# Patient Record
Sex: Male | Born: 1985
Health system: Southern US, Community
[De-identification: ages and names within clinical notes are randomized; demographics above are authoritative.]

## PROBLEM LIST (undated history)

## (undated) DIAGNOSIS — B019 Varicella without complication: Secondary | ICD-10-CM

## (undated) DIAGNOSIS — R7303 Prediabetes: Secondary | ICD-10-CM

## (undated) HISTORY — PX: NO PAST SURGERIES: SHX2092

## (undated) HISTORY — DX: Varicella without complication: B01.9

## (undated) HISTORY — DX: Prediabetes: R73.03

---

## 2016-08-09 ENCOUNTER — Encounter: Payer: Self-pay | Admitting: Family Medicine

## 2016-08-09 ENCOUNTER — Ambulatory Visit (INDEPENDENT_AMBULATORY_CARE_PROVIDER_SITE_OTHER): Payer: 59 | Admitting: Family Medicine

## 2016-08-09 VITALS — BP 122/82 | HR 69 | Temp 99.0°F | Resp 18 | Ht 67.0 in | Wt 232.0 lb

## 2016-08-09 DIAGNOSIS — Z0000 Encounter for general adult medical examination without abnormal findings: Secondary | ICD-10-CM

## 2016-08-09 DIAGNOSIS — Z130 Encounter for screening for diseases of the blood and blood-forming organs and certain disorders involving the immune mechanism: Secondary | ICD-10-CM | POA: Diagnosis not present

## 2016-08-09 DIAGNOSIS — E669 Obesity, unspecified: Secondary | ICD-10-CM

## 2016-08-09 LAB — COMPREHENSIVE METABOLIC PANEL
ALT: 25 U/L (ref 0–53)
AST: 13 U/L (ref 0–37)
Albumin: 4.4 g/dL (ref 3.5–5.2)
Alkaline Phosphatase: 35 U/L — ABNORMAL LOW (ref 39–117)
BILIRUBIN TOTAL: 0.5 mg/dL (ref 0.2–1.2)
BUN: 19 mg/dL (ref 6–23)
CALCIUM: 9.6 mg/dL (ref 8.4–10.5)
CHLORIDE: 105 meq/L (ref 96–112)
CO2: 29 meq/L (ref 19–32)
CREATININE: 0.92 mg/dL (ref 0.40–1.50)
GFR: 100.71 mL/min (ref >60.00)
GLUCOSE: 91 mg/dL (ref 70–99)
Potassium: 4.5 mEq/L (ref 3.5–5.1)
SODIUM: 139 meq/L (ref 135–145)
Total Protein: 7.6 g/dL (ref 6.0–8.3)

## 2016-08-09 LAB — LIPID PANEL
CHOL/HDL RATIO: 4
Cholesterol: 181 mg/dL (ref 0–200)
HDL: 43 mg/dL (ref >39.00)
LDL CALC: 121 mg/dL — AB (ref 0–99)
NONHDL: 137.72
Triglycerides: 83 mg/dL (ref 0.0–149.0)
VLDL: 16.6 mg/dL (ref 0.0–40.0)

## 2016-08-09 LAB — CBC
HCT: 43.7 % (ref 39.0–52.0)
HEMOGLOBIN: 14.4 g/dL (ref 13.0–17.0)
MCHC: 33 g/dL (ref 30.0–36.0)
MCV: 73.6 fl — ABNORMAL LOW (ref 78.0–100.0)
Platelets: 319 10*3/uL (ref 150.0–400.0)
RBC: 5.94 Mil/uL — ABNORMAL HIGH (ref 4.22–5.81)
RDW: 14.6 % (ref 11.5–15.5)
WBC: 12.3 10*3/uL — ABNORMAL HIGH (ref 4.0–10.5)

## 2016-08-09 LAB — HEMOGLOBIN A1C: Hgb A1c MFr Bld: 5.8 % (ref 4.6–6.5)

## 2016-08-09 MED ORDER — VARENICLINE TARTRATE 1 MG PO TABS: 1.0000 mg | ORAL_TABLET | Freq: Two times a day (BID) | ORAL | 0 refills | Status: DC

## 2016-08-09 MED ORDER — VARENICLINE TARTRATE 0.5 MG X 11 & 1 MG X 42 PO MISC: ORAL | 0 refills | Status: DC

## 2016-08-09 NOTE — Assessment & Plan Note (Signed)
Screening labs today. He is losing weight. Starting Chantix for smoking cessation.

## 2016-08-09 NOTE — Patient Instructions (Signed)
Chantix as prescribed.  Follow up in 3-6 months. Call for refill on Chantix after 12 weeks if desired.  Take care  Dr. Adriana Simasook    Health Maintenance, Male A healthy lifestyle and preventive care is important for your health and wellness. Ask your health care provider about what schedule of regular examinations is right for you. What should I know about weight and diet? Eat a Healthy Diet  Eat plenty of vegetables, fruits, whole grains, low-fat dairy products, and lean protein.  Do not eat a lot of foods high in solid fats, added sugars, or salt.  Maintain a Healthy Weight Regular exercise can help you achieve or maintain a healthy weight. You should:  Do at least 150 minutes of exercise each week. The exercise should increase your heart rate and make you sweat (moderate-intensity exercise).  Do strength-training exercises at least twice a week.  Watch Your Levels of Cholesterol and Blood Lipids  Have your blood tested for lipids and cholesterol every 5 years starting at 33 years of age. If you are at high risk for heart disease, you should start having your blood tested when you are 33 years old. You may need to have your cholesterol levels checked more often if: ? Your lipid or cholesterol levels are high. ? You are older than 33 years of age. ? You are at high risk for heart disease.  What should I know about cancer screening? Many types of cancers can be detected early and may often be prevented. Lung Cancer  You should be screened every year for lung cancer if: ? You are a current smoker who has smoked for at least 30 years. ? You are a former smoker who has quit within the past 15 years.  Talk to your health care provider about your screening options, when you should start screening, and how often you should be screened.  Colorectal Cancer  Routine colorectal cancer screening usually begins at 33 years of age and should be repeated every 5-10 years until you are 33 years  old. You may need to be screened more often if early forms of precancerous polyps or small growths are found. Your health care provider may recommend screening at an earlier age if you have risk factors for colon cancer.  Your health care provider may recommend using home test kits to check for hidden blood in the stool.  A small camera at the end of a tube can be used to examine your colon (sigmoidoscopy or colonoscopy). This checks for the earliest forms of colorectal cancer.  Prostate and Testicular Cancer  Depending on your age and overall health, your health care provider may do certain tests to screen for prostate and testicular cancer.  Talk to your health care provider about any symptoms or concerns you have about testicular or prostate cancer.  Skin Cancer  Check your skin from head to toe regularly.  Tell your health care provider about any new moles or changes in moles, especially if: ? There is a change in a mole's size, shape, or color. ? You have a mole that is larger than a pencil eraser.  Always use sunscreen. Apply sunscreen liberally and repeat throughout the day.  Protect yourself by wearing long sleeves, pants, a wide-brimmed hat, and sunglasses when outside.  What should I know about heart disease, diabetes, and high blood pressure?  If you are 6218-33 years of age, have your blood pressure checked every 3-5 years. If you are 33 years of age  or older, have your blood pressure checked every year. You should have your blood pressure measured twice-once when you are at a hospital or clinic, and once when you are not at a hospital or clinic. Record the average of the two measurements. To check your blood pressure when you are not at a hospital or clinic, you can use: ? An automated blood pressure machine at a pharmacy. ? A home blood pressure monitor.  Talk to your health care provider about your target blood pressure.  If you are between 54-58 years old, ask your  health care provider if you should take aspirin to prevent heart disease.  Have regular diabetes screenings by checking your fasting blood sugar level. ? If you are at a normal weight and have a low risk for diabetes, have this test once every three years after the age of 47. ? If you are overweight and have a high risk for diabetes, consider being tested at a younger age or more often.  A one-time screening for abdominal aortic aneurysm (AAA) by ultrasound is recommended for men aged 9-75 years who are current or former smokers. What should I know about preventing infection? Hepatitis B If you have a higher risk for hepatitis B, you should be screened for this virus. Talk with your health care provider to find out if you are at risk for hepatitis B infection. Hepatitis C Blood testing is recommended for:  Everyone born from [birth year] through 1967.  Anyone with known risk factors for hepatitis C.  Sexually Transmitted Diseases (STDs)  You should be screened each year for STDs including gonorrhea and chlamydia if: ? You are sexually active and are younger than 33 years of age. ? You are older than 33 years of age and your health care provider tells you that you are at risk for this type of infection. ? Your sexual activity has changed since you were last screened and you are at an increased risk for chlamydia or gonorrhea. Ask your health care provider if you are at risk.  Talk with your health care provider about whether you are at high risk of being infected with HIV. Your health care provider may recommend a prescription medicine to help prevent HIV infection.  What else can I do?  Schedule regular health, dental, and eye exams.  Stay current with your vaccines (immunizations).  Do not use any tobacco products, such as cigarettes, chewing tobacco, and e-cigarettes. If you need help quitting, ask your health care provider.  Limit alcohol intake to no more than 2 drinks per day. One  drink equals 12 ounces of beer, 5 ounces of wine, or 1 ounces of hard liquor.  Do not use street drugs.  Do not share needles.  Ask your health care provider for help if you need support or information about quitting drugs.  Tell your health care provider if you often feel depressed.  Tell your health care provider if you have ever been abused or do not feel safe at home. This information is not intended to replace advice given to you by your health care provider. Make sure you discuss any questions you have with your health care provider. Document Released: 07/24/2007 Document Revised: 09/24/2015 Document Reviewed: 10/29/2014 Elsevier Interactive Patient Education  [employer].

## 2016-08-09 NOTE — Progress Notes (Signed)
Subjective:  Patient ID: Benjamin Ibarra, male    DOB: 05/14/1985  Age: 31 y.o. MRN: [id number]  CC: Annual exam; wants to quit smoking.  HPI Benjamin Ibarra is a 31 y.o. male presents to the clinic today for an annual exam. Wants to quit smoking.  Preventative Healthcare  Immunizations  Tetanus - Up to date.  Pneumococcal - Candidate for given smoking history.  Labs: Screening labs today.  Alcohol use: 1x/week.  Smoking/tobacco use: Current smoker. Wants to quit. Has tried Chantix.  PMH, Surgical Hx, Family Hx, Social History reviewed and updated as below.  Past Medical History:  Diagnosis Date  . Chicken pox    Past Surgical History:  Procedure Laterality Date  . NO PAST SURGERIES     Family History  Problem Relation Age of Onset  . Diabetes Mother   . Alzheimer's disease Maternal Grandmother   . Cancer Maternal Grandfather    Social History  Substance Use Topics  . Smoking status: Current Every Day Smoker    Packs/day: 1.00  . Smokeless tobacco: Never Used  . Alcohol use Yes   Review of Systems General: Denies unexplained weight loss, fever. Skin: Denies new or changing mole, sore/wound that won't heal. ENT: Trouble hearing, ringing in the ears, sores in the mouth, hoarseness, trouble swallowing. Eyes: Denies trouble seeing/visual disturbance. Heart/CV: Denies chest pain, shortness of breath, edema, palpitations. Lungs/Resp: Denies cough, shortness of breath, hemoptysis. Abd/GI: Denies nausea, vomiting, diarrhea, constipation, abdominal pain, hematochezia, melena. GU: Denies dysuria, incontinence, hematuria, urinary frequency, difficulty starting/keeping stream, penile discharge, sexual difficulty, lump in testicles. MSK: Denies joint pain/swelling, myalgias. Neuro: Denies headaches, weakness, numbness, dizziness, syncope. Psych: Denies sadness, anxiety, stress, memory difficulty. Endocrine: Denies polyuria and polydipsia.  Objective:   Today's Vitals: BP  122/82 (BP Location: Left Arm, Patient Position: Sitting, Cuff Size: Large)   Pulse 69   Temp 99 F (37.2 C) (Oral)   Resp 18   Ht 5\' 7"  (1.702 m)   Wt 232 lb (105.2 kg)   SpO2 98%   BMI 36.34 kg/m   Physical Exam  Constitutional: He is oriented to person, place, and time. He appears well-developed and well-nourished. No distress.  HENT:  Head: Normocephalic and atraumatic.  Nose: Nose normal.  Mouth/Throat: Oropharynx is clear and moist. No oropharyngeal exudate.  Normal TM's bilaterally.   Eyes: Conjunctivae are normal. No scleral icterus.  Neck: Neck supple.  Cardiovascular: Normal rate and regular rhythm.   No murmur heard. Pulmonary/Chest: Effort normal and breath sounds normal. He has no wheezes. He has no rales.  Abdominal: Soft. He exhibits no distension. There is no tenderness. There is no rebound and no guarding.  Musculoskeletal: Normal range of motion. He exhibits no edema.  Lymphadenopathy:    He has no cervical adenopathy.  Neurological: He is alert and oriented to person, place, and time.  Skin: Skin is warm and dry. No rash noted.  Psychiatric: He has a normal mood and affect.  Vitals reviewed.  Assessment & Plan:   Problem List Items Addressed This Visit      Other   Annual physical exam - Primary    Screening labs today. He is losing weight. Starting Chantix for smoking cessation.      Obesity (BMI 35.0-39.9 without comorbidity)   Relevant Orders   Hemoglobin A1c   Comprehensive metabolic panel   Lipid panel    Other Visit Diagnoses    Screening for deficiency anemia       Relevant Orders  CBC     Meds ordered this encounter  Medications  . varenicline (CHANTIX STARTING MONTH PAK) 0.5 MG X 11 & 1 MG X 42 tablet    Sig: 0.5 mg by mouth once daily for 3 days, then increase to 0.5 mg twice daily for 4 days, then increase to 1 mg tablet twice daily.    Dispense:  53 tablet    Refill:  0  . varenicline (CHANTIX CONTINUING MONTH PAK) 1 MG  tablet    Sig: Take 1 tablet (1 mg total) by mouth 2 (two) times daily.    Dispense:  120 tablet    Refill:  0    Follow-up: 3-6 months  Ferman Basilio Adriana Simasook DO [facility] [place]

## 2016-09-25 ENCOUNTER — Other Ambulatory Visit: Payer: Self-pay | Admitting: Family Medicine

## 2016-09-27 NOTE — Telephone Encounter (Signed)
Please advise to filling script for starter pack of chantix?

## 2016-09-27 NOTE — Telephone Encounter (Signed)
Shouldn't be refilling starter pack.

## 2016-10-03 ENCOUNTER — Other Ambulatory Visit: Payer: Self-pay | Admitting: Family Medicine

## 2016-10-04 NOTE — Telephone Encounter (Signed)
Should this be the regular dose pack, since starting pack was already sent?

## 2016-10-06 NOTE — Telephone Encounter (Signed)
Please find out what he has been taking recently. Thanks.

## 2016-10-07 MED ORDER — VARENICLINE TARTRATE 1 MG PO TABS: 1.0000 mg | ORAL_TABLET | Freq: Two times a day (BID) | ORAL | 0 refills | Status: DC

## 2016-10-07 NOTE — Telephone Encounter (Signed)
Sent to pharmacy 

## 2016-10-07 NOTE — Telephone Encounter (Signed)
Spoke with the patient he needs the continuation pack, not the starting has been on it for the past month, thanks

## 2017-05-16 ENCOUNTER — Ambulatory Visit (INDEPENDENT_AMBULATORY_CARE_PROVIDER_SITE_OTHER): Payer: 59 | Admitting: Internal Medicine

## 2017-05-16 ENCOUNTER — Encounter: Payer: Self-pay | Admitting: Internal Medicine

## 2017-05-16 VITALS — BP 122/86 | HR 88 | Temp 98.8°F | Ht 67.0 in | Wt 246.0 lb

## 2017-05-16 DIAGNOSIS — Z716 Tobacco abuse counseling: Secondary | ICD-10-CM

## 2017-05-16 DIAGNOSIS — R7303 Prediabetes: Secondary | ICD-10-CM | POA: Insufficient documentation

## 2017-05-16 DIAGNOSIS — F17210 Nicotine dependence, cigarettes, uncomplicated: Secondary | ICD-10-CM | POA: Diagnosis not present

## 2017-05-16 DIAGNOSIS — Z720 Tobacco use: Secondary | ICD-10-CM

## 2017-05-16 MED ORDER — VARENICLINE TARTRATE 1 MG PO TABS: 1.0000 mg | ORAL_TABLET | Freq: Two times a day (BID) | ORAL | 3 refills | Status: AC

## 2017-05-16 MED ORDER — BUPROPION HCL ER (SR) 150 MG PO TB12: ORAL_TABLET | ORAL | 3 refills | Status: AC

## 2017-05-16 MED ORDER — VARENICLINE TARTRATE 0.5 MG PO TABS: ORAL_TABLET | ORAL | 0 refills | Status: AC

## 2017-05-16 NOTE — Progress Notes (Signed)
Chief Complaint  Patient presents with  . Follow-up    transfer From Dr. Adriana Simasook   F/u  1. Tobacco abuse 1 ppd wants refill of Chantix he was on 0.5 bid and stopped taking ~ 1-2 months ago. He did not like gum in the past and patch did not help  2. Disc prev prediabetes pt will start exercising.  3. 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  HENT: Negative for hearing loss.   Eyes: Negative for blurred vision.  Respiratory: Positive for shortness of breath.        SOB with exertion   Cardiovascular: Negative for chest pain.  Gastrointestinal: Negative for abdominal pain and blood in stool.  Musculoskeletal: Negative for joint pain.  Skin: Negative for rash.  Neurological: Negative for headaches.  Psychiatric/Behavioral: Negative for depression.   Past Medical History:  Diagnosis Date  . Chicken pox   . Prediabetes    Past Surgical History:  Procedure Laterality Date  . NO PAST SURGERIES     Family History  Problem Relation Age of Onset  . Diabetes Mother   . Alzheimer's disease Maternal Grandmother   . Cancer Maternal Grandfather    Social History   Socioeconomic History  . Marital status: Married    Spouse name: Not on file  . Number of children: Not on file  . Years of education: Not on file  . Highest education level: Not on file  Occupational History  . Not on file  Social Needs  . Financial resource strain: Not on file  . Food insecurity:    Worry: Not on file    Inability: Not on file  . Transportation needs:    Medical: Not on file    Non-medical: Not on file  Tobacco Use  . Smoking status: Current Every Day Smoker    Packs/day: 1.00  . Smokeless tobacco: Never Used  Substance and Sexual Activity  . Alcohol use: Yes  . Drug use: No  . Sexual activity: Yes  Lifestyle  . Physical activity:    Days per week: Not on file    Minutes per session: Not on file  . Stress: Not on file   Relationships  . Social connections:    Talks on phone: Not on file    Gets together: Not on file    Attends religious service: Not on file    Active member of club or organization: Not on file    Attends meetings of clubs or organizations: Not on file    Relationship status: Not on file  . Intimate partner violence:    Fear of current or ex partner: Not on file    Emotionally abused: Not on file    Physically abused: Not on file    Forced sexual activity: Not on file  Other Topics Concern  . Not on file  Social History Narrative   Emergency planning/management officerolice officer in [place]    Married    2 kids    Family from [place]   Current Meds  Medication Sig  . varenicline (CHANTIX) 0.5 MG tablet 0.5 daily x 3 days then increase 0.5 2x per day day 4-7  . [DISCONTINUED] varenicline (CHANTIX CONTINUING MONTH PAK) 1 MG tablet Take 1 tablet (1 mg total) by mouth 2 (two) times daily.   Allergies  Allergen Reactions  . Apple Itching    redness of skin  . Soy Allergy Itching   No  results found for this or any previous visit (from the past 2160 hour(s)). Objective  Body mass index is 38.53 kg/m. Wt Readings from Last 3 Encounters:  05/16/17 246 lb (111.6 kg)  08/09/16 232 lb (105.2 kg)   Temp Readings from Last 3 Encounters:  05/16/17 98.8 F (37.1 C) (Oral)  08/09/16 99 F (37.2 C) (Oral)   BP Readings from Last 3 Encounters:  05/16/17 122/86  08/09/16 122/82   Pulse Readings from Last 3 Encounters:  05/16/17 88  08/09/16 69    Physical Exam  Constitutional: He is oriented to person, place, and time. Vital signs are normal. He appears well-developed and well-nourished.  HENT:  Head: Normocephalic and atraumatic.  Mouth/Throat: Oropharynx is clear and moist and mucous membranes are normal.  Eyes: Pupils are equal, round, and reactive to light. Conjunctivae are normal.  Cardiovascular: Normal rate, regular rhythm and normal heart sounds.  Pulmonary/Chest: Effort normal and breath sounds  normal.  Neurological: He is alert and oriented to person, place, and time. He has normal strength. Gait normal.  Skin: Skin is warm, dry and intact.  Psychiatric: He has a normal mood and affect. His speech is normal and behavior is normal. Judgment and thought content normal. Cognition and memory are normal.  Nursing note and vitals reviewed.   Assessment   1. Tobacco abuse  2. preDM  3. HM Plan  1. Will Rx Chantix 0.5 mg qd x 3 days increase to bid x 4-7 days then increase to 1 mg bid  Add wellbutrin as well  rec smoking cessation  Spent 3 minutes disc options smoking cessation   2. Exercise to lose and healthy diet choices  Advise recheck A1C  3.  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 French Ana McLean-Scocuzza-Internal Medicine

## 2017-05-16 NOTE — Patient Instructions (Addendum)
F/u in 3-4 months Check with police dept on hepatitis B titer surface antibody titer needs to be 10 or greater for protection  Fax copy of recent labs to me Dr. French Ana McLean-Scocuzza [phone]  Make sure they checked CMET, CBC, urine, TSH, T4, lipid, hep B titer  Have them check A1C   Results for Benjamin Ibarra, Benjamin Ibarra (MRN [id number]) as of 05/16/2017 16:09  Ref. Range 08/09/2016 14:04  Hemoglobin A1C Latest Ref Range: 4.6 - 6.5 % 5.8  Prediabetic range     Smoking Tobacco Information Smoking tobacco will very likely harm your health. Tobacco contains a poisonous (toxic), colorless chemical called nicotine. Nicotine affects the brain and makes tobacco addictive. This change in your brain can make it hard to stop smoking. Tobacco also has other toxic chemicals that can hurt your body and raise your risk of many cancers. How can smoking tobacco affect me? Smoking tobacco can increase your chances of having serious health conditions, such as:  Cancer. Smoking is most commonly associated with lung cancer, but can lead to cancer in other parts of the body.  Chronic obstructive pulmonary disease (COPD). This is a long-term lung condition that makes it hard to breathe. It also gets worse over time.  High blood pressure (hypertension), heart disease, stroke, or heart attack.  Lung infections, such as pneumonia.  Cataracts. This is when the lenses in the eyes become clouded.  Digestive problems. This may include peptic ulcers, heartburn, and gastroesophageal reflux disease (GERD).  Oral health problems, such as gum disease and tooth loss.  Loss of taste and smell.  Smoking can affect your appearance by causing:  Wrinkles.  Yellow or stained teeth, fingers, and fingernails.  Smoking tobacco can also affect your social life.  Many workplaces, [employer], hotels, and public places are tobacco-free. This means that you may experience challenges in finding places to smoke when away from  home.  The cost of a smoking habit can be expensive. Expenses for someone who smokes come in two ways: ? You spend money on a regular basis to buy tobacco. ? Your health care costs in the long-term are higher if you smoke.  Tobacco smoke can also affect the health of those around you. Children of smokers have greater chances of: ? Sudden infant death syndrome (SIDS). ? Ear infections. ? Lung infections.  What lifestyle changes can be made?  Do not start smoking. Quit if you already do.  To quit smoking: ? Make a plan to quit smoking and commit yourself to it. Look for programs to help you and ask your health care provider for recommendations and ideas. ? Talk with your health care provider about using nicotine replacement medicines to help you quit. Medicine replacement medicines include gum, lozenges, patches, sprays, or pills. ? Do not replace cigarette smoking with electronic cigarettes, which are commonly called e-cigarettes. The safety of e-cigarettes is not known, and some may contain harmful chemicals. ? Avoid places, people, or situations that tempt you to smoke. ? If you try to quit but return to smoking, don't give up hope. It is very common for people to try a number of times before they fully succeed. When you feel ready again, give it another try.  Quitting smoking might affect the way you eat as well as your weight. Be prepared to monitor your eating habits. Get support in planning and following a healthy diet.  Ask your health care provider about having regular tests (screenings) to check for cancer.  This may include blood tests, imaging tests, and other tests.  Exercise regularly. Consider taking walks, joining a gym, or doing yoga or exercise classes.  Develop skills to manage your stress. These skills include meditation. What are the benefits of quitting smoking? By quitting smoking, you may:  Lower your risk of getting cancer and other diseases caused by  smoking.  Live longer.  Breathe better.  Lower your blood pressure and heart rate.  Stop your addiction to tobacco.  Stop creating secondhand smoke that hurts other people.  Improve your sense of taste and smell.  Look better over time, due to having fewer wrinkles and less staining.  What can happen if changes are not made? If you do not stop smoking, you may:  Get cancer and other diseases.  Develop COPD or other long-term (chronic) lung conditions.  Develop serious problems with your heart and blood vessels (cardiovascular system).  Need more tests to screen for problems caused by smoking.  Have higher, long-term healthcare costs from medicines or treatments related to smoking.  Continue to have worsening changes in your lungs, mouth, and nose.  Where to find support: To get support to quit smoking, consider:  Asking your health care provider for more information and resources.  Taking classes to learn more about quitting smoking.  Looking for local organizations that offer resources about quitting smoking.  Joining a support group for people who want to quit smoking in your local community.  Where to find more information: You may find more information about quitting smoking from:  HelpGuide.org: www.helpguide.org/articles/addictions/how-to-quit-smoking.htm  BankRights[employer]: smokefree.gov  American Lung Association: www.lung.org  Contact a health care provider if:  You have problems breathing.  Your lips, nose, or fingers turn blue.  You have chest pain.  You are coughing up blood.  You feel faint or you pass out.  You have other noticeable changes that cause you to worry. Summary  Smoking tobacco can negatively affect your health, the health of those around you, your finances, and your social life.  Do not start smoking. Quit if you already do. If you need help quitting, ask your health care provider.  Think about joining a support group for  people who want to quit smoking in your local community. There are many effective programs that will help you to quit this behavior. This information is not intended to replace advice given to you by your health care provider. Make sure you discuss any questions you have with your health care provider. Document Released: 02/10/2016 Document Revised: 02/10/2016 Document Reviewed: 02/10/2016 Elsevier Interactive Patient Education  2018 [employer].   Bupropion sustained-release tablets (Depression/Mood Disorders) What is this medicine? BUPROPION (byoo PROE pee on) is used to treat depression. This medicine may be used for other purposes; ask your health care provider or pharmacist if you have questions. COMMON BRAND NAME(S): Budeprion SR, Wellbutrin SR What should I tell my health care provider before I take this medicine? They need to know if you have any of these conditions: -an eating disorder, such as anorexia or bulimia -bipolar disorder or psychosis -diabetes or high blood sugar, treated with medication -glaucoma -head injury or brain tumor -heart disease, previous heart attack, or irregular heart beat -high blood pressure -kidney or liver disease -seizures -suicidal thoughts or a previous suicide attempt -Tourette's syndrome -weight loss -an unusual or allergic reaction to bupropion, other medicines, foods, dyes, or preservatives -breast-feeding -pregnant or trying to become pregnant How should I use this medicine? Take this medicine  by mouth with a glass of water. Follow the directions on the prescription label. You can take it with or without food. If it upsets your stomach, take it with food. Do not cut, crush or chew this medicine. Take your medicine at regular intervals. If you take this medicine more than once a day, take your second dose at least 8 hours after you take your first dose. To limit difficulty in sleeping, avoid taking this medicine at bedtime. Do not take your  medicine more often than directed. Do not stop taking this medicine suddenly except upon the advice of your doctor. Stopping this medicine too quickly may cause serious side effects or your condition may worsen. A special MedGuide will be given to you by the pharmacist with each prescription and refill. Be sure to read this information carefully each time. Talk to your pediatrician regarding the use of this medicine in children. Special care may be needed. Overdosage: If you think you have taken too much of this medicine contact a poison control center or emergency room at once. NOTE: This medicine is only for you. Do not share this medicine with others. What if I miss a dose? If you miss a dose, skip the missed dose and take your next tablet at the regular time. There should be at least 8 hours between doses. Do not take double or extra doses. What may interact with this medicine? Do not take this medicine with any of the following medications: -linezolid -MAOIs like Azilect, Carbex, Eldepryl, Marplan, Nardil, and Parnate -methylene blue (injected into a vein) -other medicines that contain bupropion like Zyban This medicine may also interact with the following medications: -alcohol -certain medicines for anxiety or sleep -certain medicines for blood pressure like metoprolol, propranolol -certain medicines for depression or psychotic disturbances -certain medicines for HIV or AIDS like efavirenz, lopinavir, nelfinavir, ritonavir -certain medicines for irregular heart beat like propafenone, flecainide -certain medicines for Parkinson's disease like amantadine, levodopa -certain medicines for seizures like carbamazepine, phenytoin, phenobarbital -cimetidine -clopidogrel -cyclophosphamide -digoxin -furazolidone -isoniazid -nicotine -orphenadrine -procarbazine -steroid medicines like prednisone or cortisone -stimulant medicines for attention disorders, weight loss, or to stay  awake -tamoxifen -theophylline -thiotepa -ticlopidine -tramadol -warfarin This list may not describe all possible interactions. Give your health care provider a list of all the medicines, herbs, non-prescription drugs, or dietary supplements you use. Also tell them if you smoke, drink alcohol, or use illegal drugs. Some items may interact with your medicine. What should I watch for while using this medicine? Tell your doctor if your symptoms do not get better or if they get worse. Visit your doctor or health care professional for regular checks on your progress. Because it may take several weeks to see the full effects of this medicine, it is important to continue your treatment as prescribed by your doctor. Patients and their families should watch out for new or worsening thoughts of suicide or depression. Also watch out for sudden changes in feelings such as feeling anxious, agitated, panicky, irritable, hostile, aggressive, impulsive, severely restless, overly excited and hyperactive, or not being able to sleep. If this happens, especially at the beginning of treatment or after a change in dose, call your health care professional. Avoid alcoholic drinks while taking this medicine. Drinking excessive alcoholic beverages, using sleeping or anxiety medicines, or quickly stopping the use of these agents while taking this medicine may increase your risk for a seizure. Do not drive or use heavy machinery until you know how this medicine  affects you. This medicine can impair your ability to perform these tasks. Do not take this medicine close to bedtime. It may prevent you from sleeping. Your mouth may get dry. Chewing sugarless gum or sucking hard candy, and drinking plenty of water may help. Contact your doctor if the problem does not go away or is severe. What side effects may I notice from receiving this medicine? Side effects that you should report to your doctor or health care professional as soon  as possible: -allergic reactions like skin rash, itching or hives, swelling of the face, lips, or tongue -breathing problems -changes in vision -confusion -elevated mood, decreased need for sleep, racing thoughts, impulsive behavior -fast or irregular heartbeat -hallucinations, loss of contact with reality -increased blood pressure -redness, blistering, peeling or loosening of the skin, including inside the mouth -seizures -suicidal thoughts or other mood changes -unusually weak or tired -vomiting Side effects that usually do not require medical attention (report to your doctor or health care professional if they continue or are bothersome): -constipation -headache -loss of appetite -nausea -tremors -weight loss This list may not describe all possible side effects. Call your doctor for medical advice about side effects. You may report side effects to FDA at 1-800-FDA-1088. Where should I keep my medicine? Keep out of the reach of children. Store at room temperature between 20 and 25 degrees C (68 and 77 degrees F), away from direct sunlight and moisture. Keep tightly closed. Throw away any unused medicine after the expiration date. NOTE: This sheet is a summary. It may not cover all possible information. If you have questions about this medicine, talk to your doctor, pharmacist, or health care provider.  2018 Elsevier/Gold Standard (2015-07-18 13:52:19)   Exercising to Lose Weight Exercising can help you to lose weight. In order to lose weight through exercise, you need to do vigorous-intensity exercise. You can tell that you are exercising with vigorous intensity if you are breathing very hard and fast and cannot hold a conversation while exercising. Moderate-intensity exercise helps to maintain your current weight. You can tell that you are exercising at a moderate level if you have a higher heart rate and faster breathing, but you are still able to hold a conversation. How often  should I exercise? Choose an activity that you enjoy and set realistic goals. Your health care provider can help you to make an activity plan that works for you. Exercise regularly as directed by your health care provider. This may include:  Doing resistance training twice each week, such as: ? Push-ups. ? Sit-ups. ? Lifting weights. ? Using resistance bands.  Doing a given intensity of exercise for a given amount of time. Choose from these options: ? 150 minutes of moderate-intensity exercise every week. ? 75 minutes of vigorous-intensity exercise every week. ? A mix of moderate-intensity and vigorous-intensity exercise every week.  Children, pregnant women, people who are out of shape, people who are overweight, and older adults may need to consult a health care provider for individual recommendations. If you have any sort of medical condition, be sure to consult your health care provider before starting a new exercise program. What are some activities that can help me to lose weight?  Walking at a rate of at least 4.5 miles an hour.  Jogging or running at a rate of 5 miles per hour.  Biking at a rate of at least 10 miles per hour.  Lap swimming.  Roller-skating or in-line skating.  Cross-country skiing.  Vigorous  competitive sports, such as football, basketball, and soccer.  Jumping rope.  Aerobic dancing. How can I be more active in my day-to-day activities?  Use the stairs instead of the elevator.  Take a walk during your lunch break.  If you drive, park your car farther away from work or school.  If you take public transportation, get off one stop early and walk the rest of the way.  Make all of your phone calls while standing up and walking around.  Get up, stretch, and walk around every 30 minutes throughout the day. What guidelines should I follow while exercising?  Do not exercise so much that you hurt yourself, feel dizzy, or get very short of  breath.  Consult your health care provider prior to starting a new exercise program.  Wear comfortable clothes and shoes with good support.  Drink plenty of water while you exercise to prevent dehydration or heat stroke. Body water is lost during exercise and must be replaced.  Work out until you breathe faster and your heart beats faster. This information is not intended to replace advice given to you by your health care provider. Make sure you discuss any questions you have with your health care provider. Document Released: 02/27/2010 Document Revised: 07/03/2015 Document Reviewed: 06/28/2013 Elsevier Interactive Patient Education  [employer].

## 2017-05-16 NOTE — Progress Notes (Signed)
Pre visit review using our clinic review tool, if applicable. No additional management support is needed unless otherwise documented below in the visit note. 

## 2017-05-30 ENCOUNTER — Emergency Department: Payer: 59

## 2017-05-30 ENCOUNTER — Other Ambulatory Visit: Payer: Self-pay

## 2017-05-30 ENCOUNTER — Emergency Department
Admission: EM | Admit: 2017-05-30 | Discharge: 2017-05-30 | Disposition: A | Discharge status: Home / Self Care | Payer: 59 | Attending: Emergency Medicine | Admitting: Emergency Medicine

## 2017-05-30 ENCOUNTER — Encounter: Payer: Self-pay | Admitting: Emergency Medicine

## 2017-05-30 DIAGNOSIS — R079 Chest pain, unspecified: Secondary | ICD-10-CM | POA: Diagnosis present

## 2017-05-30 DIAGNOSIS — Z5321 Procedure and treatment not carried out due to patient leaving prior to being seen by health care provider: Secondary | ICD-10-CM | POA: Insufficient documentation

## 2017-05-30 LAB — CBC
HEMATOCRIT: 42.6 % (ref 40.0–52.0)
Hemoglobin: 14.1 g/dL (ref 13.0–18.0)
MCH: 24.9 pg — ABNORMAL LOW (ref 26.0–34.0)
MCHC: 33.1 g/dL (ref 32.0–36.0)
MCV: 75.2 fL — AB (ref 80.0–100.0)
PLATELETS: 272 10*3/uL (ref 150–440)
RBC: 5.67 MIL/uL (ref 4.40–5.90)
RDW: 14.6 % — AB (ref 11.5–14.5)
WBC: 13.5 10*3/uL — AB (ref 3.8–10.6)

## 2017-05-30 LAB — BASIC METABOLIC PANEL
Anion gap: 10 (ref 5–15)
BUN: 12 mg/dL (ref 6–20)
CHLORIDE: 103 mmol/L (ref 101–111)
CO2: 24 mmol/L (ref 22–32)
Calcium: 8.8 mg/dL — ABNORMAL LOW (ref 8.9–10.3)
Creatinine, Ser: 1.16 mg/dL (ref 0.61–1.24)
Glucose, Bld: 121 mg/dL — ABNORMAL HIGH (ref 65–99)
POTASSIUM: 3.4 mmol/L — AB (ref 3.5–5.1)
SODIUM: 137 mmol/L (ref 135–145)

## 2017-05-30 LAB — TROPONIN I: Troponin I: <0.03 ng/mL (ref <0.03)

## 2017-05-30 NOTE — ED Triage Notes (Signed)
C/O generalized chest pain that started two hours after working out.

## 2017-05-31 ENCOUNTER — Telehealth: Payer: Self-pay | Admitting: Emergency Medicine

## 2017-05-31 NOTE — Telephone Encounter (Signed)
Called patient due to lwot to inquire about condition and follow up plans. Says he is feeling better.  Advised to call pcp to inform of symptoms he had to see if further testing is necesary

## 2017-08-15 ENCOUNTER — Ambulatory Visit: Payer: 59 | Admitting: Internal Medicine

## 2017-08-18 ENCOUNTER — Ambulatory Visit
Admission: RE | Admit: 2017-08-18 | Discharge: 2017-08-18 | Disposition: A | Discharge status: Home / Self Care | Payer: No Typology Code available for payment source | Source: Ambulatory Visit | Attending: Nurse Practitioner | Admitting: Nurse Practitioner

## 2017-08-18 ENCOUNTER — Other Ambulatory Visit: Payer: Self-pay | Admitting: Nurse Practitioner

## 2017-08-18 DIAGNOSIS — Z021 Encounter for pre-employment examination: Secondary | ICD-10-CM

## 2018-08-25 ENCOUNTER — Encounter (HOSPITAL_COMMUNITY): Payer: Self-pay | Admitting: Emergency Medicine

## 2018-08-25 ENCOUNTER — Emergency Department (HOSPITAL_COMMUNITY)
Admission: EM | Admit: 2018-08-25 | Discharge: 2018-08-25 | Disposition: A | Discharge status: Home / Self Care | Payer: No Typology Code available for payment source | Attending: Emergency Medicine | Admitting: Emergency Medicine

## 2018-08-25 ENCOUNTER — Other Ambulatory Visit: Payer: Self-pay

## 2018-08-25 DIAGNOSIS — S6981XA Other specified injuries of right wrist, hand and finger(s), initial encounter: Secondary | ICD-10-CM | POA: Diagnosis not present

## 2018-08-25 DIAGNOSIS — S161XXA Strain of muscle, fascia and tendon at neck level, initial encounter: Secondary | ICD-10-CM | POA: Insufficient documentation

## 2018-08-25 DIAGNOSIS — F17200 Nicotine dependence, unspecified, uncomplicated: Secondary | ICD-10-CM | POA: Insufficient documentation

## 2018-08-25 DIAGNOSIS — Z79899 Other long term (current) drug therapy: Secondary | ICD-10-CM | POA: Diagnosis not present

## 2018-08-25 DIAGNOSIS — S6982XA Other specified injuries of left wrist, hand and finger(s), initial encounter: Secondary | ICD-10-CM | POA: Diagnosis not present

## 2018-08-25 DIAGNOSIS — Y92410 Unspecified street and highway as the place of occurrence of the external cause: Secondary | ICD-10-CM | POA: Diagnosis not present

## 2018-08-25 DIAGNOSIS — Y9389 Activity, other specified: Secondary | ICD-10-CM | POA: Diagnosis not present

## 2018-08-25 DIAGNOSIS — S1980XA Other specified injuries of unspecified part of neck, initial encounter: Secondary | ICD-10-CM | POA: Diagnosis present

## 2018-08-25 DIAGNOSIS — Y998 Other external cause status: Secondary | ICD-10-CM | POA: Diagnosis not present

## 2018-08-25 DIAGNOSIS — M79641 Pain in right hand: Secondary | ICD-10-CM

## 2018-08-25 DIAGNOSIS — M79642 Pain in left hand: Secondary | ICD-10-CM
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
## 2018-08-25 NOTE — Discharge Instructions (Signed)
Take NSAIDs or Tylenol as needed for the next week. Take this medicine with food. °Use a heating pad for sore muscles - use for 20 minutes several times a day °Return for worsening symptoms ° °

## 2018-08-25 NOTE — ED Notes (Addendum)
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  Pain is minimal.
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
## 2019-03-30 ENCOUNTER — Ambulatory Visit: Payer: Self-pay | Attending: Internal Medicine

## 2019-03-30 DIAGNOSIS — Z23 Encounter for immunization: Secondary | ICD-10-CM | POA: Insufficient documentation

## 2019-03-30 NOTE — Progress Notes (Signed)
   Covid-19 Vaccination Clinic  Name:  Benjamin Ibarra    MRN: [id number] DOB: 1985-06-30  03/30/2019  Mr. Benjamin Ibarra was observed post Covid-19 immunization for 15 minutes without incidence. He was provided with Vaccine Information Sheet and instruction to access the V-Safe system.   Mr. Benjamin Ibarra was instructed to call 911 with any severe reactions post vaccine: Marland Kitchen Difficulty breathing  . Swelling of your face and throat  . A fast heartbeat  . A bad rash all over your body  . Dizziness and weakness    Immunizations Administered    Name Date Dose VIS Date Route   Pfizer COVID-19 Vaccine 03/30/2019  3:13 PM 0.3 mL 01/19/2019 Intramuscular   Manufacturer: [employer], [employer]   Lot: [id number]   NDC: [address]-1

## 2019-04-24 ENCOUNTER — Ambulatory Visit: Payer: Self-pay

## 2021-02-28 ENCOUNTER — Emergency Department (HOSPITAL_COMMUNITY): Payer: No Typology Code available for payment source

## 2021-02-28 ENCOUNTER — Other Ambulatory Visit: Payer: Self-pay

## 2021-02-28 ENCOUNTER — Emergency Department (HOSPITAL_COMMUNITY)
Admission: EM | Admit: 2021-02-28 | Discharge: 2021-02-28 | Disposition: A | Discharge status: Home / Self Care | Payer: No Typology Code available for payment source | Attending: Emergency Medicine | Admitting: Emergency Medicine

## 2021-02-28 DIAGNOSIS — S63502A Unspecified sprain of left wrist, initial encounter: Secondary | ICD-10-CM | POA: Diagnosis not present

## 2021-02-28 DIAGNOSIS — R0789 Other chest pain: Secondary | ICD-10-CM | POA: Diagnosis not present

## 2021-02-28 DIAGNOSIS — R7309 Other abnormal glucose: Secondary | ICD-10-CM | POA: Insufficient documentation

## 2021-02-28 DIAGNOSIS — S0990XA Unspecified injury of head, initial encounter: Secondary | ICD-10-CM | POA: Diagnosis present

## 2021-02-28 DIAGNOSIS — S0081XA Abrasion of other part of head, initial encounter: Secondary | ICD-10-CM | POA: Diagnosis not present

## 2021-02-28 DIAGNOSIS — Y92410 Unspecified street and highway as the place of occurrence of the external cause: Secondary | ICD-10-CM | POA: Insufficient documentation

## 2021-02-28 DIAGNOSIS — M79674 Pain in right toe(s): Secondary | ICD-10-CM | POA: Diagnosis not present

## 2021-02-28 DIAGNOSIS — Z23 Encounter for immunization: Secondary | ICD-10-CM | POA: Diagnosis not present

## 2021-02-28 DIAGNOSIS — T07XXXA Unspecified multiple injuries, initial encounter: Secondary | ICD-10-CM

## 2021-02-28 LAB — CBC WITH DIFFERENTIAL/PLATELET
Abs Immature Granulocytes: 0.03 10*3/uL (ref 0.00–0.07)
Basophils Absolute: 0.1 10*3/uL (ref 0.0–0.1)
Basophils Relative: 1 %
Eosinophils Absolute: 0.1 10*3/uL (ref 0.0–0.5)
Eosinophils Relative: 1 %
HCT: 46.7 % (ref 39.0–52.0)
Hemoglobin: 15 g/dL (ref 13.0–17.0)
Immature Granulocytes: 0 %
Lymphocytes Relative: 41 %
Lymphs Abs: 3.2 10*3/uL (ref 0.7–4.0)
MCH: 24.5 pg — ABNORMAL LOW (ref 26.0–34.0)
MCHC: 32.1 g/dL (ref 30.0–36.0)
MCV: 76.4 fL — ABNORMAL LOW (ref 80.0–100.0)
Monocytes Absolute: 0.4 10*3/uL (ref 0.1–1.0)
Monocytes Relative: 6 %
Neutro Abs: 4 10*3/uL (ref 1.7–7.7)
Neutrophils Relative %: 51 %
Platelets: 274 10*3/uL (ref 150–400)
RBC: 6.11 MIL/uL — ABNORMAL HIGH (ref 4.22–5.81)
RDW: 14.4 % (ref 11.5–15.5)
WBC: 7.8 10*3/uL (ref 4.0–10.5)
nRBC: 0 % (ref 0.0–0.2)

## 2021-02-28 LAB — COMPREHENSIVE METABOLIC PANEL
ALT: 21 U/L (ref 0–44)
AST: 18 U/L (ref 15–41)
Albumin: 4.2 g/dL (ref 3.5–5.0)
Alkaline Phosphatase: 39 U/L (ref 38–126)
Anion gap: 10 (ref 5–15)
BUN: 8 mg/dL (ref 6–20)
CO2: 23 mmol/L (ref 22–32)
Calcium: 9.3 mg/dL (ref 8.9–10.3)
Chloride: 105 mmol/L (ref 98–111)
Creatinine, Ser: 0.98 mg/dL (ref 0.61–1.24)
GFR, Estimated: >60 mL/min (ref >60)
Glucose, Bld: 85 mg/dL (ref 70–99)
Potassium: 3.9 mmol/L (ref 3.5–5.1)
Sodium: 138 mmol/L (ref 135–145)
Total Bilirubin: 0.4 mg/dL (ref 0.3–1.2)
Total Protein: 7.2 g/dL (ref 6.5–8.1)

## 2021-02-28 LAB — I-STAT CHEM 8, ED
BUN: 9 mg/dL (ref 6–20)
Calcium, Ion: 1.09 mmol/L — ABNORMAL LOW (ref 1.15–1.40)
Chloride: 104 mmol/L (ref 98–111)
Creatinine, Ser: 0.9 mg/dL (ref 0.61–1.24)
Glucose, Bld: 81 mg/dL (ref 70–99)
HCT: 47 % (ref 39.0–52.0)
Hemoglobin: 16 g/dL (ref 13.0–17.0)
Potassium: 3.9 mmol/L (ref 3.5–5.1)
Sodium: 139 mmol/L (ref 135–145)
TCO2: 25 mmol/L (ref 22–32)

## 2021-02-28 LAB — CBG MONITORING, ED: Glucose-Capillary: 84 mg/dL (ref 70–99)

## 2021-02-28 MED ORDER — IOHEXOL 300 MG/ML  SOLN
100.0000 mL | Freq: Once | INTRAMUSCULAR | Status: AC | PRN
  Administered 2021-02-28: 100 mL via INTRAVENOUS

## 2021-02-28 MED ORDER — TETANUS-DIPHTH-ACELL PERTUSSIS 5-2.5-18.5 LF-MCG/0.5 IM SUSY
0.5000 mL | PREFILLED_SYRINGE | Freq: Once | INTRAMUSCULAR | Status: AC
  Administered 2021-02-28: 0.5 mL via INTRAMUSCULAR
  Filled 2021-02-28: qty 0.5
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
## 2021-02-28 NOTE — ED Notes (Signed)
Patient transported to X-ray 

## 2021-09-30 ENCOUNTER — Other Ambulatory Visit: Payer: Self-pay | Admitting: Orthopaedic Surgery

## 2021-09-30 DIAGNOSIS — S92901D Unspecified fracture of right foot, subsequent encounter for fracture with routine healing: Secondary | ICD-10-CM

## 2021-10-27 ENCOUNTER — Other Ambulatory Visit: Payer: 59

## 2021-11-24 ENCOUNTER — Ambulatory Visit
Admission: RE | Admit: 2021-11-24 | Discharge: 2021-11-24 | Disposition: A | Discharge status: Home / Self Care | Payer: No Typology Code available for payment source | Source: Ambulatory Visit | Attending: Orthopaedic Surgery | Admitting: Orthopaedic Surgery

## 2021-11-24 DIAGNOSIS — S92901D Unspecified fracture of right foot, subsequent encounter for fracture with routine healing: Secondary | ICD-10-CM
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
